# Patient Record
Sex: Male | Born: 1960 | Race: White | Hispanic: No | Marital: Married | Smoking: Former smoker
Health system: Southern US, Community
[De-identification: ages and names within clinical notes are randomized; demographics above are authoritative.]

## PROBLEM LIST (undated history)

## (undated) DIAGNOSIS — I10 Essential (primary) hypertension: Secondary | ICD-10-CM

## (undated) DIAGNOSIS — Z8489 Family history of other specified conditions: Secondary | ICD-10-CM

## (undated) DIAGNOSIS — Z87442 Personal history of urinary calculi: Secondary | ICD-10-CM

## (undated) HISTORY — PX: LITHOTRIPSY: SUR834

---

## 2014-10-25 HISTORY — PX: COLONOSCOPY: SHX174

## 2017-08-17 HISTORY — PX: BRONCHOSCOPY: SUR163

## 2017-08-25 HISTORY — PX: LUNG BIOPSY: SHX232

## 2017-12-01 ENCOUNTER — Ambulatory Visit (HOSPITAL_COMMUNITY)
Admission: EM | Admit: 2017-12-01 | Discharge: 2017-12-01 | Disposition: A | Discharge status: Home / Self Care | Payer: 59 | Attending: Family Medicine | Admitting: Family Medicine

## 2017-12-01 ENCOUNTER — Encounter (HOSPITAL_COMMUNITY): Payer: Self-pay | Admitting: Family Medicine

## 2017-12-01 DIAGNOSIS — I10 Essential (primary) hypertension: Secondary | ICD-10-CM

## 2017-12-01 DIAGNOSIS — Z760 Encounter for issue of repeat prescription: Secondary | ICD-10-CM

## 2017-12-01 HISTORY — DX: Essential (primary) hypertension: I10

## 2017-12-01 MED ORDER — LOSARTAN POTASSIUM 100 MG PO TABS: 100.0000 mg | ORAL_TABLET | Freq: Every day | ORAL | 0 refills | Status: DC

## 2017-12-01 MED ORDER — AMLODIPINE BESYLATE 10 MG PO TABS: 10.0000 mg | ORAL_TABLET | Freq: Every day | ORAL | 0 refills | Status: DC

## 2017-12-01 NOTE — ED Triage Notes (Signed)
Pt here for BP refill. He takes amlodipine 10 mg and Losartan 100 mg. Has been taking his losartan but out of amlodipine. Denies nay chest pain, headaches, dizziness or SOB.

## 2017-12-05 NOTE — ED Provider Notes (Signed)
  [place]   [id number] 12/01/17 Arrival Time: 1004  ASSESSMENT & PLAN:  1. Essential hypertension   2. Medication refill     Meds ordered this encounter  Medications  . amLODipine (NORVASC) 10 MG tablet    Sig: Take 1 tablet (10 mg total) by mouth daily.    Dispense:  90 tablet    Refill:  0  . losartan (COZAAR) 100 MG tablet    Sig: Take 1 tablet (100 mg total) by mouth daily.    Dispense:  90 tablet    Refill:  0   May f/u here as needed.  Reviewed expectations re: course of current medical issues. Questions answered. Outlined signs and symptoms indicating need for more acute intervention. Patient verbalized understanding. After Visit Summary given.   SUBJECTIVE: History from: patient. Chase Hogan is a 57 y.o. male who presents requesting medication refill. No current concerns.  Current medical problems include: Past Medical History:  Diagnosis Date  . Hypertension    Current Outpatient Medications:  .  amLODipine (NORVASC) 10 MG tablet, Take 1 tablet (10 mg total) by mouth daily., Disp: 90 tablet, Rfl: 0 .  losartan (COZAAR) 100 MG tablet, Take 1 tablet (100 mg total) by mouth daily., Disp: 90 tablet, Rfl: 0  ROS: As per HPI.   OBJECTIVE:  Vitals:   12/01/17 1042  BP: (!) 149/87  Pulse: 83  Resp: 18  Temp: 98.2 F (36.8 C)  SpO2: 97%    General appearance: alert; no distress Lungs: clear to auscultation bilaterally Heart: regular rate and rhythm Abdomen: soft, non-tender Back: no CVA tenderness Extremities: no cyanosis or edema; symmetrical with no gross deformities Skin: warm and dry Neurologic: normal gait; normal symmetric reflexes Psychological: alert and cooperative; normal mood and affect  No Known Allergies  Past Medical History:  Diagnosis Date  . Hypertension    Social History   Socioeconomic History  . Marital status: Married    Spouse name: Not on file  . Number of children: Not on file  . Years of education: Not  on file  . Highest education level: Not on file  Social Needs  . Financial resource strain: Not on file  . Food insecurity - worry: Not on file  . Food insecurity - inability: Not on file  . Transportation needs - medical: Not on file  . Transportation needs - non-medical: Not on file  Occupational History  . Not on file  Tobacco Use  . Smoking status: Never Smoker  . Smokeless tobacco: Never Used  Substance and Sexual Activity  . Alcohol use: Not on file  . Drug use: Not on file  . Sexual activity: Not on file  Other Topics Concern  . Not on file  Social History Narrative  . Not on file      Vanessa Kick, MD 12/05/17 [phone]

## 2018-01-10 ENCOUNTER — Other Ambulatory Visit: Payer: Self-pay | Admitting: *Deleted

## 2018-01-10 ENCOUNTER — Other Ambulatory Visit: Payer: Self-pay

## 2018-01-10 ENCOUNTER — Encounter: Payer: Self-pay | Admitting: Thoracic Surgery (Cardiothoracic Vascular Surgery)

## 2018-01-10 ENCOUNTER — Institutional Professional Consult (permissible substitution) (INDEPENDENT_AMBULATORY_CARE_PROVIDER_SITE_OTHER): Payer: 59 | Admitting: Thoracic Surgery (Cardiothoracic Vascular Surgery)

## 2018-01-10 VITALS — BP 147/89 | HR 89 | Resp 18 | Ht 66.0 in | Wt 202.6 lb

## 2018-01-10 DIAGNOSIS — R911 Solitary pulmonary nodule: Secondary | ICD-10-CM

## 2018-01-10 DIAGNOSIS — J9859 Other diseases of mediastinum, not elsewhere classified: Secondary | ICD-10-CM | POA: Diagnosis not present

## 2018-01-10 DIAGNOSIS — R918 Other nonspecific abnormal finding of lung field: Secondary | ICD-10-CM

## 2018-01-10 NOTE — Progress Notes (Signed)
PCP is Patient, No Pcp Per Referring Provider is Adrian Prows, MD  Chief Complaint  Patient presents with  . Lung Mass    New patient, Chest CT 12/19/2017    HPI: Mr. Carico is sent for consultation regarding a mediastinal mass.  Mr. 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  He went for a physical exam.  A chest x-ray showed a questionable lung nodule.  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  He had bronchoscopy.  He thinks that was in November.  It showed benign lymphoid cells.  A repeat CT recently showed an increase in size of the AP window mass to 5.2 x 7.5 cm.  He says they advised him to have surgery, but he wanted to come to [facility] as he is moving here in 2 weeks.  He saw Dr. Thayer Jew far.  An ECG showed poor R wave progression.  He was referred to Dr. Einar Gip.  He scheduled a stress test for early April.  He has not been having any chest pain, pressure, or tightness.  He does get short of breath with heavy exertion but that has not changed recently.  He has noted itching.  He denies fevers, chills, night sweats, change in appetite, weight loss, headaches, and visual changes.  He does use smokeless tobacco.  He smoked briefly but quit at age 89.  He continues to work full-time.   Past Medical History:  Diagnosis Date  . Hypertension     Past Surgical History:  Procedure Laterality Date  . BRONCHOSCOPY  08/17/2017  . COLONOSCOPY  2016  . LUNG BIOPSY  08/2017    Family History  Problem Relation Age of Onset  . Cancer Mother   . Other Father        has pacemaker    Social History Social History   Tobacco Use  . Smoking status: Former Research scientist (life sciences)  . Smokeless tobacco: Current User    Types: Chew  . Tobacco comment: quit in 20's  Substance Use Topics  . Alcohol use: Yes    Comment: Beer  . Drug use: No    Current Outpatient Medications   Medication Sig Dispense Refill  . amLODipine (NORVASC) 10 MG tablet Take 1 tablet (10 mg total) by mouth daily. 90 tablet 0  . losartan (COZAAR) 100 MG tablet Take 1 tablet (100 mg total) by mouth daily. 90 tablet 0  . Multiple Vitamin (MULTIVITAMIN) tablet Take 1 tablet by mouth daily.     No current facility-administered medications for this visit.     Allergies  Allergen Reactions  . Lisinopril Cough    Review of Systems  Constitutional: Negative for activity change, appetite change, chills, diaphoresis, fever and unexpected weight change.  HENT: Negative for trouble swallowing and voice change.   Eyes: Negative for visual disturbance.  Respiratory: Positive for shortness of breath. Negative for cough and wheezing.   Cardiovascular: Negative for chest pain, palpitations and leg swelling.  Gastrointestinal: Negative for abdominal distention and abdominal pain.  Endocrine: Negative for polydipsia and polyphagia.  Genitourinary: Negative for dysuria and hematuria.       History kidney stones  Musculoskeletal: Negative for arthralgias and myalgias.  Neurological: Negative for weakness and headaches.  Hematological: Negative for adenopathy. Does not bruise/bleed easily.  All other systems reviewed and are negative.   BP (!) 147/89 (BP Location: Left  Arm, Patient Position: Sitting, Cuff Size: Large)   Pulse 89   Resp 18   Ht 5\' 6"  (1.676 m)   Wt 202 lb 9.6 oz (91.9 kg)   SpO2 97% Comment: RA  BMI 32.70 kg/m  Physical Exam  Constitutional: He is oriented to person, place, and time. He appears well-developed and well-nourished. No distress.  HENT:  Head: Normocephalic and atraumatic.  Mouth/Throat: No oropharyngeal exudate.  Eyes: Conjunctivae and EOM are normal. No scleral icterus.  Neck: Neck supple. No thyromegaly present.  Cardiovascular: Normal rate, regular rhythm and normal heart sounds. Exam reveals no gallop and no friction rub.  No murmur heard. Pulmonary/Chest:  Effort normal. No respiratory distress. He has no wheezes.  Abdominal: Soft. He exhibits no distension. There is no tenderness.  Musculoskeletal: He exhibits no edema.  Lymphadenopathy:    He has no cervical adenopathy.  Neurological: He is alert and oriented to person, place, and time. No cranial nerve deficit. He exhibits normal muscle tone.  Skin: Skin is warm and dry.  Vitals reviewed.    Diagnostic Tests: CT chest [facility] 12/19/2017 Official report reads Conclusions Enlarging aggressive appearing AP window mass.  By report a prior biopsy was negative.  However, with its enlarging size and appearance recommend repeat tissue diagnosis. Indeterminate bilateral pulmonary nodules Bilateral nephrolithiasis   I personally reviewed the CT images and concur with the findings of a large AP window mass.  There are multiple small lung nodules.  Impression: Mr. 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  There is no definite invasion of surrounding structures but there is significant extrinsic compression of the left pulmonary artery.  The differential diagnosis includes metastatic lung cancer, lymphoma, and vasculitis.  Lymphoma is the most likely, but I have seen Takayasu's arteritis present in a similar fashion.   He needs a tissue biopsy to establish a diagnosis.  I am not surprised that they were unable to obtain a diagnosis with a needle aspiration.  More tissue will be necessary to definitively determine the disease process.  Based on his CT it appears that mediastinoscopy would allow access to biopsy the mass.  Barbra Sarks procedure is another consideration.  I think the first step at this point is to do a PET/CT.  That will guide our initial diagnostic workup.  It could reveal another site of disease it would be easier to biopsy.  If the disease is isolated to the aortopulmonary window the next step would be  mediastinum anoscopy.  I described the general nature of mediastinoscopy to him.  He understands this is a surgical procedure done in the operating room under general anesthesia.  It is diagnostic and not curative.  We would plan to do it as an outpatient.  Overall it is a relatively low risk procedure, but the rare complications do happen can be serious.  I informed him of the indications, risks, benefits, and alternatives.  He understands the risk include, but not limited to death, MI, DVT, PE, bleeding, possible need for transfusion, possible need for sternotomy or thoracotomy, stroke, infection, recurrent nerve injury leading to hoarseness, pneumothorax, as well as possibility of other unforeseeable complications.  Plan: PET/CT  Return to discuss results of PET and schedule mediastinoscopy (if that turns out to be the best approach)  [name], MD [facility] [phone]

## 2018-01-27 ENCOUNTER — Encounter (HOSPITAL_COMMUNITY)
Admission: RE | Admit: 2018-01-27 | Discharge: 2018-01-27 | Disposition: A | Discharge status: Home / Self Care | Payer: 59 | Source: Ambulatory Visit | Attending: Thoracic Surgery (Cardiothoracic Vascular Surgery) | Admitting: Thoracic Surgery (Cardiothoracic Vascular Surgery)

## 2018-01-27 ENCOUNTER — Encounter: Payer: Self-pay | Admitting: Cardiology

## 2018-01-27 DIAGNOSIS — R918 Other nonspecific abnormal finding of lung field: Secondary | ICD-10-CM | POA: Diagnosis present

## 2018-01-27 LAB — GLUCOSE, CAPILLARY: Glucose-Capillary: 102 mg/dL — ABNORMAL HIGH (ref 65–99)

## 2018-01-27 MED ORDER — FLUDEOXYGLUCOSE F - 18 (FDG) INJECTION
10.0000 | Freq: Once | INTRAVENOUS | Status: AC | PRN
  Administered 2018-01-27: 10 via INTRAVENOUS

## 2018-01-31 ENCOUNTER — Encounter: Payer: Self-pay | Admitting: Thoracic Surgery (Cardiothoracic Vascular Surgery)

## 2018-01-31 ENCOUNTER — Other Ambulatory Visit: Payer: Self-pay | Admitting: *Deleted

## 2018-01-31 ENCOUNTER — Other Ambulatory Visit: Payer: Self-pay

## 2018-01-31 ENCOUNTER — Ambulatory Visit (INDEPENDENT_AMBULATORY_CARE_PROVIDER_SITE_OTHER): Payer: 59 | Admitting: Thoracic Surgery (Cardiothoracic Vascular Surgery)

## 2018-01-31 VITALS — BP 130/78 | HR 78 | Resp 18 | Ht 66.0 in | Wt 199.0 lb

## 2018-01-31 DIAGNOSIS — J9859 Other diseases of mediastinum, not elsewhere classified: Secondary | ICD-10-CM

## 2018-01-31 DIAGNOSIS — Q214 Aortopulmonary septal defect: Secondary | ICD-10-CM

## 2018-01-31 NOTE — Progress Notes (Signed)
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  A chest x-ray showed a possible lung nodule.  A CT of the chest showed a 4.6 x 6.5 cm aortopulmonary window mass.  Bronchoscopy with endobronchial ultrasound showed benign lymphoid cells.  He recently had a repeat CT which showed the mass had increased to 5.2 x 7.5 cm.  He was advised to have surgery in [employer] but he declined as he was planning to moved to [place].  After moving to town he saw Dr. Shelia Media.  An ECG showed poor R wave progression.  He was referred to Dr. Einar Gip.  He had not had any chest pain, pressure, or tightness, but is scheduled for a stress test.  He has been feeling well.  He denies fevers, chills, night sweats, chest pain, shortness of breath, weight loss or change in appetite.  Zubrod Score: At the time of surgery this patient's most appropriate activity status/level should be described as: [x]     0    Normal activity, no symptoms []     1    Restricted in physical strenuous activity but ambulatory, able to do out light work []     2    Ambulatory and capable of self care, unable to do work activities, up and about >50 % of waking hours                              []     3    Only limited self care, in bed greater than 50% of waking hours []     4    Completely disabled, no self care, confined to bed or chair []     5    Moribund Past Medical History:  Diagnosis Date  . Hypertension     Current Outpatient Medications  Medication Sig Dispense Refill  . amLODipine (NORVASC) 10 MG tablet Take 1 tablet (10 mg total) by mouth daily. 90 tablet 0  . losartan (COZAAR) 100 MG tablet Take 1 tablet (100 mg total) by mouth daily. 90 tablet 0  . Multiple Vitamin  (MULTIVITAMIN) tablet Take 1 tablet by mouth daily.     No current facility-administered medications for this visit.     Physical Exam BP 130/78 (BP Location: Left Arm, Patient Position: Sitting, Cuff Size: Large)   Pulse 78   Resp 18   Ht 5\' 6"  (1.676 m)   Wt 199 lb (90.3 kg)   SpO2 98% Comment: RA  BMI 32.11 kg/m  57 year old man in no acute distress Alert and oriented x3 with no focal deficits  Diagnostic Tests:   Impression: Chase Hogan is a 57 year old man with a 5 x 7 cm aortopulmonary window mass that is markedly hypermetabolic on PET/CT.  This mass has been present since last fall when it was first discovered while he was living in [place].  A previous needle aspiration was nondiagnostic.  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  Malignant thymoma is in the differential but this would be a very unusual appearance for that.  Germ cell tumors are also in the  differential.  Regardless he needs a tissue biopsy to guide treatment.  I recommended that we proceed with a mediastinum anoscopy.  I think we should be able to get the diagnosis from that approach, but if we are not able to I would then proceed at the same setting with a left anterior mediastinotomy Barbra Sarks).  I informed the patient and his wife of the general nature of the procedure.  They understand this is diagnostic and not therapeutic.  They understand the general nature of the surgery and the incisions to be used.  I informed him of the indications, risks, benefits, and alternatives.  They understand the risk include, but are not limited to death, MI, DVT, PE, bleeding, possible need for transfusion, possible need for sternotomy or thoracotomy, recurrent nerve injury leading to hoarseness, pneumothorax, as well as the possibility of other unforeseeable complications.  He accepts the risks and wishes to proceed.  I spoke with Dr. Adrian Prows by telephone.  He feels the patient is low risk and okay to proceed with surgery.   He will try to see if he can get the stress test done prior to the procedure.  Plan: Mediastinoscopy, possible left anterior mediastinotomy on Friday, 02/10/2018  [name], MD [facility] [phone]

## 2018-01-31 NOTE — H&P (View-Only) (Signed)
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  A chest x-ray showed a possible lung nodule.  A CT of the chest showed a 4.6 x 6.5 cm aortopulmonary window mass.  Bronchoscopy with endobronchial ultrasound showed benign lymphoid cells.  He recently had a repeat CT which showed the mass had increased to 5.2 x 7.5 cm.  He was advised to have surgery in [place] but he declined as he was planning to moved to [place].  After moving to town he saw Dr. Shelia Media.  An ECG showed poor R wave progression.  He was referred to Dr. Einar Gip.  He had not had any chest pain, pressure, or tightness, but is scheduled for a stress test.  He has been feeling well.  He denies fevers, chills, night sweats, chest pain, shortness of breath, weight loss or change in appetite.  Zubrod Score: At the time of surgery this patient's most appropriate activity status/level should be described as: [x]     0    Normal activity, no symptoms []     1    Restricted in physical strenuous activity but ambulatory, able to do out light work []     2    Ambulatory and capable of self care, unable to do work activities, up and about >50 % of waking hours                              []     3    Only limited self care, in bed greater than 50% of waking hours []     4    Completely disabled, no self care, confined to bed or chair []     5    Moribund Past Medical History:  Diagnosis Date  . Hypertension     Current Outpatient Medications  Medication Sig Dispense Refill  . amLODipine (NORVASC) 10 MG tablet Take 1 tablet (10 mg total) by mouth daily. 90 tablet 0  . losartan (COZAAR) 100 MG tablet Take 1 tablet (100 mg total) by mouth daily. 90 tablet 0  . Multiple Vitamin  (MULTIVITAMIN) tablet Take 1 tablet by mouth daily.     No current facility-administered medications for this visit.     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  This mass has been present since last fall when it was first discovered while he was living in [place].  A previous needle aspiration was nondiagnostic.  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  Malignant thymoma is in the differential but this would be a very unusual appearance for that.  Germ cell tumors are also in the  differential.  Regardless he needs a tissue biopsy to guide treatment.  I recommended that we proceed with a mediastinum anoscopy.  I think we should be able to get the diagnosis from that approach, but if we are not able to I would then proceed at the same setting with a left anterior mediastinotomy Chase Hogan).  I informed the patient and his wife of the general nature of the procedure.  They understand this is diagnostic and not therapeutic.  They understand the general nature of the surgery and the incisions to be used.  I informed him of the indications, risks, benefits, and alternatives.  They understand the risk include, but are not limited to death, MI, DVT, PE, bleeding, possible need for transfusion, possible need for sternotomy or thoracotomy, recurrent nerve injury leading to hoarseness, pneumothorax, as well as the possibility of other unforeseeable complications.  He accepts the risks and wishes to proceed.  I spoke with Dr. Adrian Prows by telephone.  He feels the patient is low risk and okay to proceed with surgery.   He will try to see if he can get the stress test done prior to the procedure.  Plan: Mediastinoscopy, possible left anterior mediastinotomy on Friday, 02/10/2018  [name], MD [facility] [phone]

## 2018-02-01 ENCOUNTER — Encounter: Payer: Self-pay | Admitting: *Deleted

## 2018-02-02 NOTE — Progress Notes (Signed)
Order(s) created erroneously. Erroneous order ID: [id number]  Order moved by: Kelly Splinter  Order move date/time: 02/02/2018 2:05 PM  Source Patient: [id number]  Source Contact: 02/01/2018  Destination Patient: [id number]  Destination Contact: 02/03/2017

## 2018-02-07 NOTE — Pre-Procedure Instructions (Signed)
Chase Hogan  02/07/2018      [id number] [place] [place] [address] [place] [address] Phone: [phone] Fax: [phone]    Your procedure is scheduled on Friday April 19.  Report to [facility] Admitting at 5:30 A.M.  Call this number if you have problems the morning of surgery:  [phone]   Remember:  Do not eat food or drink liquids after midnight.  Take these medicines the morning of surgery with A SIP OF WATER: amlodipine (Norvasc)  7 days prior to surgery STOP taking any Aspirin(unless otherwise instructed by your surgeon), Aleve, Naproxen, Ibuprofen, Motrin, Advil, Goody's, BC's, all herbal medications, fish oil, and all vitamins    Do not wear jewelry, make-up or nail polish.  Do not wear lotions, powders, or perfumes, or deodorant.  Do not shave 48 hours prior to surgery.  Men may shave face and neck.  Do not bring valuables to the hospital.  [facility] is not responsible for any belongings or valuables.  Contacts, dentures or bridgework may not be worn into surgery.  Leave your suitcase in the car.  After surgery it may be brought to your room.  For patients admitted to the hospital, discharge time will be determined by your treatment team.  Patients discharged the day of surgery will not be allowed to drive home.   Special instructions:    [place]- Preparing For Surgery  Before surgery, you can play an important role. Because skin is not sterile, your skin needs to be as free of germs as possible. You can reduce the number of germs on your skin by washing with CHG (chlorahexidine gluconate) Soap before surgery.  CHG is an antiseptic cleaner which kills germs and bonds with the skin to continue killing germs even after washing.  Please do not use if you have an allergy to CHG or antibacterial soaps. If your skin becomes reddened/irritated stop using the  CHG.  Do not shave (including legs and underarms) for at least 48 hours prior to first CHG shower. It is OK to shave your face.  Please follow these instructions carefully.   1. Shower the NIGHT BEFORE SURGERY and the MORNING OF SURGERY with CHG.   2. If you chose to wash your hair, wash your hair first as usual with your normal shampoo.  3. After you shampoo, rinse your hair and body thoroughly to remove the shampoo.  4. Use CHG as you would any other liquid soap. You can apply CHG directly to the skin and wash gently with a scrungie or a clean washcloth.   5. Apply the CHG Soap to your body ONLY FROM THE NECK DOWN.  Do not use on open wounds or open sores. Avoid contact with your eyes, ears, mouth and genitals (private parts). Wash Face and genitals (private parts)  with your normal soap.  6. Wash thoroughly, paying special attention to the area where your surgery will be performed.  7. Thoroughly rinse your body with warm water from the neck down.  8. DO NOT shower/wash with your normal soap after using and rinsing off the CHG Soap.  9. Pat yourself dry with a CLEAN TOWEL.  10. Wear CLEAN PAJAMAS to bed the night before surgery, wear comfortable clothes the morning of surgery  11. Place CLEAN SHEETS on your bed the night of your first shower and DO NOT SLEEP WITH PETS.  Day of Surgery: Do not apply any deodorants/lotions. Please wear clean clothes to the hospital/surgery center.      Please read over the following fact sheets that you were given. Coughing and Deep Breathing, MRSA Information and Surgical Site Infection Prevention

## 2018-02-08 ENCOUNTER — Encounter (HOSPITAL_COMMUNITY): Payer: Self-pay

## 2018-02-08 ENCOUNTER — Ambulatory Visit (HOSPITAL_COMMUNITY)
Admission: RE | Admit: 2018-02-08 | Discharge: 2018-02-08 | Disposition: A | Discharge status: Home / Self Care | Payer: 59 | Source: Ambulatory Visit | Attending: Thoracic Surgery (Cardiothoracic Vascular Surgery) | Admitting: Thoracic Surgery (Cardiothoracic Vascular Surgery)

## 2018-02-08 ENCOUNTER — Other Ambulatory Visit: Payer: Self-pay

## 2018-02-08 ENCOUNTER — Encounter (HOSPITAL_COMMUNITY)
Admission: RE | Admit: 2018-02-08 | Discharge: 2018-02-08 | Disposition: A | Discharge status: Home / Self Care | Payer: 59 | Source: Ambulatory Visit | Attending: Thoracic Surgery (Cardiothoracic Vascular Surgery) | Admitting: Thoracic Surgery (Cardiothoracic Vascular Surgery)

## 2018-02-08 DIAGNOSIS — R918 Other nonspecific abnormal finding of lung field: Secondary | ICD-10-CM | POA: Diagnosis not present

## 2018-02-08 DIAGNOSIS — I10 Essential (primary) hypertension: Secondary | ICD-10-CM | POA: Diagnosis not present

## 2018-02-08 DIAGNOSIS — Z0183 Encounter for blood typing: Secondary | ICD-10-CM | POA: Insufficient documentation

## 2018-02-08 DIAGNOSIS — Z87442 Personal history of urinary calculi: Secondary | ICD-10-CM | POA: Insufficient documentation

## 2018-02-08 DIAGNOSIS — Q214 Aortopulmonary septal defect: Secondary | ICD-10-CM

## 2018-02-08 DIAGNOSIS — Z01818 Encounter for other preprocedural examination: Secondary | ICD-10-CM | POA: Diagnosis not present

## 2018-02-08 DIAGNOSIS — Z79899 Other long term (current) drug therapy: Secondary | ICD-10-CM | POA: Diagnosis not present

## 2018-02-08 DIAGNOSIS — Z01812 Encounter for preprocedural laboratory examination: Secondary | ICD-10-CM | POA: Diagnosis not present

## 2018-02-08 HISTORY — DX: Family history of other specified conditions: Z84.89

## 2018-02-08 HISTORY — DX: Personal history of urinary calculi: Z87.442

## 2018-02-08 LAB — TYPE AND SCREEN
ABO/RH(D): O POS
Antibody Screen: NEGATIVE

## 2018-02-08 LAB — CBC
HEMATOCRIT: 37.4 % — AB (ref 39.0–52.0)
Hemoglobin: 12.3 g/dL — ABNORMAL LOW (ref 13.0–17.0)
MCH: 27.7 pg (ref 26.0–34.0)
MCHC: 32.9 g/dL (ref 30.0–36.0)
MCV: 84.2 fL (ref 78.0–100.0)
PLATELETS: 382 10*3/uL (ref 150–400)
RBC: 4.44 MIL/uL (ref 4.22–5.81)
RDW: 13.9 % (ref 11.5–15.5)
WBC: 7.6 10*3/uL (ref 4.0–10.5)

## 2018-02-08 LAB — BLOOD GAS, ARTERIAL
ACID-BASE EXCESS: 1.9 mmol/L (ref 0.0–2.0)
Bicarbonate: 25.4 mmol/L (ref 20.0–28.0)
DRAWN BY: 470591
FIO2: 0.21
O2 Saturation: 96.8 %
PCO2 ART: 35.9 mmHg (ref 32.0–48.0)
PH ART: 7.463 — AB (ref 7.350–7.450)
Patient temperature: 98.6
pO2, Arterial: 85.3 mmHg (ref 83.0–108.0)

## 2018-02-08 LAB — COMPREHENSIVE METABOLIC PANEL
ALT: 18 U/L (ref 17–63)
AST: 19 U/L (ref 15–41)
Albumin: 4.1 g/dL (ref 3.5–5.0)
Alkaline Phosphatase: 89 U/L (ref 38–126)
Anion gap: 10 (ref 5–15)
BUN: 10 mg/dL (ref 6–20)
CALCIUM: 9.5 mg/dL (ref 8.9–10.3)
CHLORIDE: 105 mmol/L (ref 101–111)
CO2: 22 mmol/L (ref 22–32)
CREATININE: 0.95 mg/dL (ref 0.61–1.24)
Glucose, Bld: 98 mg/dL (ref 65–99)
Potassium: 3.7 mmol/L (ref 3.5–5.1)
Sodium: 137 mmol/L (ref 135–145)
Total Bilirubin: 0.5 mg/dL (ref 0.3–1.2)
Total Protein: 7.2 g/dL (ref 6.5–8.1)

## 2018-02-08 LAB — URINALYSIS, ROUTINE W REFLEX MICROSCOPIC
Bilirubin Urine: NEGATIVE
GLUCOSE, UA: NEGATIVE mg/dL
HGB URINE DIPSTICK: NEGATIVE
Ketones, ur: NEGATIVE mg/dL
LEUKOCYTES UA: NEGATIVE
Nitrite: NEGATIVE
PROTEIN: NEGATIVE mg/dL
Specific Gravity, Urine: 1.018 (ref 1.005–1.030)
pH: 6 (ref 5.0–8.0)

## 2018-02-08 LAB — ABO/RH: ABO/RH(D): O POS

## 2018-02-08 LAB — SURGICAL PCR SCREEN
MRSA, PCR: NEGATIVE
Staphylococcus aureus: POSITIVE — AB

## 2018-02-08 LAB — APTT: APTT: 34 s (ref 24–36)

## 2018-02-08 LAB — PROTIME-INR
INR: 1.04
PROTHROMBIN TIME: 13.5 s (ref 11.4–15.2)

## 2018-02-08 NOTE — Progress Notes (Signed)
Surgical PCR +staph aureus. [phone]. Patient notified of result and to start Mupirocin as instructed at PAT appointment. Verbalized understanding.

## 2018-02-08 NOTE — Progress Notes (Signed)
PCP - no PCP Cardiologist - Dr Einar Gip, note scanned in epic   Chest x-ray - 02/08/2018  EKG - Requested from Dr. Einar Gip ECHO - 02/01/18 scanned under notes section in epic  Patient denies shortness of breath, fever, cough and chest pain at PAT appointment  Patient verbalized understanding of instructions that were given to them at the PAT appointment. Patient was also instructed that they will need to review over the PAT instructions again at home before surgery.

## 2018-02-09 NOTE — Progress Notes (Signed)
Anesthesia Chart Review:   Case:  [id number] Date/Time:  02/10/18 0845   Procedures:      MEDIASTINOSCOPY (N/A )     possible LEFT ANTERIOR MEDIASTINOTOMY CHAMBERLAIN MCNEIL (N/A )   Anesthesia type:  General   Pre-op diagnosis:  AP WINDOW MASS   Location:  [facility] OR ROOM 10 / [place] [facility]   Surgeon:  [name], MD      DISCUSSION: Pt is a 57 year old male.  Dr. Leonarda Salon 01/31/18 note documents cardiologist Dr. Einar Gip felt pt could have surgery.   VS: BP 132/77   Pulse 72   Temp 36.8 C   Resp 18   Ht 5\' 6"  (1.676 m)   SpO2 97%   BMI 32.12 kg/m   PROVIDERS: - No PCP  - Cardiologist is Kela Millin, MD who spoke with Dr. Roxan Hockey and felt pt could proceed with surgery.    LABS: Labs reviewed: Acceptable for surgery. (all labs ordered are listed, but only abnormal results are displayed)  Labs Reviewed  SURGICAL PCR SCREEN - Abnormal; Notable for the following components:      Result Value   Staphylococcus aureus POSITIVE (*)    All other components within normal limits  BLOOD GAS, ARTERIAL - Abnormal; Notable for the following components:   pH, Arterial 7.463 (*)    All other components within normal limits  CBC - Abnormal; Notable for the following components:   Hemoglobin 12.3 (*)    HCT 37.4 (*)    All other components within normal limits  APTT  COMPREHENSIVE METABOLIC PANEL  PROTIME-INR  URINALYSIS, ROUTINE W REFLEX MICROSCOPIC  TYPE AND SCREEN  ABO/RH     IMAGES: CXR 02/08/18: Mass density in the aortopulmonary window and region of the aortic arch.   EKG 12/29/17 [facility] cardiovascular): NSR. Right axis deviation. Poor R wave progression, probably normal variant. No evidence of ischemia.    CV:  Echo 01/27/18 (found in media tab):  1. LV cavity is normal in size. Moderate concentric LVH. Normal global wall motion. Doppler evidence of grade II diastolic dysfunction. Diastolic dysfunction findings suggests elevated LA/LV end diastolic pressure.  Calculated EF 61% 2. LA cavity moderately dilated at 4.6cm 3. Mild aortic regurgitation 4. Mild to moderate mitral regurgitation 5. Mild tricuspid regurgitation. No evidence of pulmonary HTN.  6. Mild pulmonic regurgitation   Past Medical History:  Diagnosis Date  . Family history of adverse reaction to anesthesia    Daughter had to have smaller breathing tube 58-24 years old  . History of kidney stones   . Hypertension     Past Surgical History:  Procedure Laterality Date  . BRONCHOSCOPY  08/17/2017  . COLONOSCOPY  2016  . LITHOTRIPSY     "a few times"  . LUNG BIOPSY  08/2017   pt unsure about this    MEDICATIONS: . amLODipine (NORVASC) 10 MG tablet  . Aspirin-Acetaminophen-Caffeine (GOODYS EXTRA STRENGTH) 520-260-32.5 MG PACK  . losartan (COZAAR) 100 MG tablet  . Multiple Vitamin (MULTIVITAMIN WITH MINERALS) TABS tablet   No current facility-administered medications for this encounter.     If no changes, I anticipate pt can proceed with surgery as scheduled.   Willeen Cass, FNP-BC [facility]/Anesthesiology Phone: [phone] 02/09/2018 9:26 AM

## 2018-02-10 ENCOUNTER — Ambulatory Visit (HOSPITAL_COMMUNITY): Payer: 59 | Admitting: Certified Registered"

## 2018-02-10 ENCOUNTER — Encounter (HOSPITAL_COMMUNITY): Payer: Self-pay | Admitting: Urology

## 2018-02-10 ENCOUNTER — Encounter (HOSPITAL_COMMUNITY)
Admission: RE | Disposition: A | Discharge status: Home / Self Care | Payer: Self-pay | Source: Ambulatory Visit | Attending: Thoracic Surgery (Cardiothoracic Vascular Surgery)

## 2018-02-10 ENCOUNTER — Ambulatory Visit (HOSPITAL_COMMUNITY): Payer: 59 | Admitting: Emergency Medicine

## 2018-02-10 ENCOUNTER — Ambulatory Visit (HOSPITAL_COMMUNITY)
Admission: RE | Admit: 2018-02-10 | Discharge: 2018-02-10 | Disposition: A | Discharge status: Home / Self Care | Payer: 59 | Source: Ambulatory Visit | Attending: Thoracic Surgery (Cardiothoracic Vascular Surgery) | Admitting: Thoracic Surgery (Cardiothoracic Vascular Surgery)

## 2018-02-10 DIAGNOSIS — I10 Essential (primary) hypertension: Secondary | ICD-10-CM | POA: Insufficient documentation

## 2018-02-10 DIAGNOSIS — Q214 Aortopulmonary septal defect: Secondary | ICD-10-CM

## 2018-02-10 DIAGNOSIS — Z79899 Other long term (current) drug therapy: Secondary | ICD-10-CM | POA: Diagnosis not present

## 2018-02-10 DIAGNOSIS — Z87891 Personal history of nicotine dependence: Secondary | ICD-10-CM | POA: Insufficient documentation

## 2018-02-10 DIAGNOSIS — C381 Malignant neoplasm of anterior mediastinum: Secondary | ICD-10-CM | POA: Diagnosis not present

## 2018-02-10 DIAGNOSIS — J9859 Other diseases of mediastinum, not elsewhere classified: Secondary | ICD-10-CM | POA: Diagnosis not present

## 2018-02-10 DIAGNOSIS — R9389 Abnormal findings on diagnostic imaging of other specified body structures: Secondary | ICD-10-CM | POA: Diagnosis present

## 2018-02-10 HISTORY — PX: MEDIASTINOSCOPY: SHX5086

## 2018-02-10 SURGERY — MEDIASTINOSCOPY
Anesthesia: General

## 2018-02-10 MED ORDER — CEFAZOLIN SODIUM-DEXTROSE 2-4 GM/100ML-% IV SOLN
INTRAVENOUS | Status: AC
  Filled 2018-02-10: qty 100

## 2018-02-10 MED ORDER — CEFAZOLIN SODIUM-DEXTROSE 2-4 GM/100ML-% IV SOLN
2.0000 g | INTRAVENOUS | Status: AC
  Administered 2018-02-10: 2 g via INTRAVENOUS

## 2018-02-10 MED ORDER — DEXAMETHASONE SODIUM PHOSPHATE 10 MG/ML IJ SOLN
INTRAMUSCULAR | Status: DC | PRN
  Administered 2018-02-10: 10 mg via INTRAVENOUS

## 2018-02-10 MED ORDER — BUPIVACAINE HCL (PF) 0.5 % IJ SOLN
INTRAMUSCULAR | Status: AC
  Filled 2018-02-10: qty 30

## 2018-02-10 MED ORDER — PHENYLEPHRINE 40 MCG/ML (10ML) SYRINGE FOR IV PUSH (FOR BLOOD PRESSURE SUPPORT)
PREFILLED_SYRINGE | INTRAVENOUS | Status: DC | PRN
  Administered 2018-02-10: 80 ug via INTRAVENOUS

## 2018-02-10 MED ORDER — DEXAMETHASONE SODIUM PHOSPHATE 10 MG/ML IJ SOLN
INTRAMUSCULAR | Status: AC
  Filled 2018-02-10: qty 1

## 2018-02-10 MED ORDER — SUGAMMADEX SODIUM 200 MG/2ML IV SOLN
INTRAVENOUS | Status: DC | PRN
  Administered 2018-02-10: 370 mg via INTRAVENOUS

## 2018-02-10 MED ORDER — SODIUM CHLORIDE 0.9% FLUSH: 3.0000 mL | INTRAVENOUS | Status: DC | PRN

## 2018-02-10 MED ORDER — OXYCODONE HCL 5 MG PO TABS: 5.0000 mg | ORAL_TABLET | Freq: Four times a day (QID) | ORAL | Status: DC | PRN

## 2018-02-10 MED ORDER — ROCURONIUM BROMIDE 100 MG/10ML IV SOLN
INTRAVENOUS | Status: DC | PRN
  Administered 2018-02-10: 60 mg via INTRAVENOUS
  Administered 2018-02-10: 40 mg via INTRAVENOUS

## 2018-02-10 MED ORDER — SUGAMMADEX SODIUM 500 MG/5ML IV SOLN
INTRAVENOUS | Status: AC
  Filled 2018-02-10: qty 5

## 2018-02-10 MED ORDER — FENTANYL CITRATE (PF) 250 MCG/5ML IJ SOLN
INTRAMUSCULAR | Status: AC
  Filled 2018-02-10: qty 5

## 2018-02-10 MED ORDER — LIDOCAINE HCL (CARDIAC) PF 100 MG/5ML IV SOSY
PREFILLED_SYRINGE | INTRAVENOUS | Status: DC | PRN
  Administered 2018-02-10: 80 mg via INTRAVENOUS

## 2018-02-10 MED ORDER — MIDAZOLAM HCL 5 MG/5ML IJ SOLN
INTRAMUSCULAR | Status: DC | PRN
  Administered 2018-02-10: 2 mg via INTRAVENOUS

## 2018-02-10 MED ORDER — PHENYLEPHRINE 40 MCG/ML (10ML) SYRINGE FOR IV PUSH (FOR BLOOD PRESSURE SUPPORT)
PREFILLED_SYRINGE | INTRAVENOUS | Status: AC
  Filled 2018-02-10: qty 10

## 2018-02-10 MED ORDER — SODIUM CHLORIDE 0.9 % IV SOLN: 250.0000 mL | INTRAVENOUS | Status: DC | PRN

## 2018-02-10 MED ORDER — PROPOFOL 10 MG/ML IV BOLUS
INTRAVENOUS | Status: DC | PRN
  Administered 2018-02-10: 160 mg via INTRAVENOUS

## 2018-02-10 MED ORDER — ACETAMINOPHEN 325 MG PO TABS: 650.0000 mg | ORAL_TABLET | ORAL | Status: DC | PRN

## 2018-02-10 MED ORDER — OXYCODONE HCL 5 MG PO TABS: 5.0000 mg | ORAL_TABLET | Freq: Once | ORAL | Status: DC | PRN

## 2018-02-10 MED ORDER — HEMOSTATIC AGENTS (NO CHARGE) OPTIME
TOPICAL | Status: DC | PRN
  Administered 2018-02-10: 1 via TOPICAL

## 2018-02-10 MED ORDER — ACETAMINOPHEN 650 MG RE SUPP: 650.0000 mg | RECTAL | Status: DC | PRN

## 2018-02-10 MED ORDER — SODIUM CHLORIDE 0.9% FLUSH: 3.0000 mL | Freq: Two times a day (BID) | INTRAVENOUS | Status: DC

## 2018-02-10 MED ORDER — LACTATED RINGERS IV SOLN
INTRAVENOUS | Status: DC
  Administered 2018-02-10 (×2): via INTRAVENOUS

## 2018-02-10 MED ORDER — OXYCODONE HCL 5 MG/5ML PO SOLN: 5.0000 mg | Freq: Once | ORAL | Status: DC | PRN

## 2018-02-10 MED ORDER — ONDANSETRON HCL 4 MG/2ML IJ SOLN
INTRAMUSCULAR | Status: DC | PRN
  Administered 2018-02-10: 4 mg via INTRAVENOUS

## 2018-02-10 MED ORDER — ONDANSETRON HCL 4 MG/2ML IJ SOLN
INTRAMUSCULAR | Status: AC
  Filled 2018-02-10: qty 2

## 2018-02-10 MED ORDER — MIDAZOLAM HCL 2 MG/2ML IJ SOLN
INTRAMUSCULAR | Status: AC
  Filled 2018-02-10: qty 2

## 2018-02-10 MED ORDER — PROPOFOL 10 MG/ML IV BOLUS
INTRAVENOUS | Status: AC
  Filled 2018-02-10: qty 20

## 2018-02-10 MED ORDER — FENTANYL CITRATE (PF) 100 MCG/2ML IJ SOLN: 25.0000 ug | INTRAMUSCULAR | Status: DC | PRN

## 2018-02-10 MED ORDER — FENTANYL CITRATE (PF) 100 MCG/2ML IJ SOLN
INTRAMUSCULAR | Status: DC | PRN
  Administered 2018-02-10 (×2): 50 ug via INTRAVENOUS
  Administered 2018-02-10: 100 ug via INTRAVENOUS
  Administered 2018-02-10: 50 ug via INTRAVENOUS
  Administered 2018-02-10: 100 ug via INTRAVENOUS

## 2018-02-10 MED ORDER — SUGAMMADEX SODIUM 200 MG/2ML IV SOLN
INTRAVENOUS | Status: AC
  Filled 2018-02-10: qty 2

## 2018-02-10 MED ORDER — OXYCODONE HCL 5 MG PO TABS: 5.0000 mg | ORAL_TABLET | Freq: Four times a day (QID) | ORAL | 0 refills | Status: DC | PRN

## 2018-02-10 MED ORDER — 0.9 % SODIUM CHLORIDE (POUR BTL) OPTIME
TOPICAL | Status: DC | PRN
  Administered 2018-02-10: 1000 mL

## 2018-02-10 SURGICAL SUPPLY — 52 items
APPLIER CLIP LOGIC TI 5 (MISCELLANEOUS) IMPLANT
CANISTER SUCT 3000ML PPV (MISCELLANEOUS) ×3 IMPLANT
CLIP VESOCCLUDE MED 24/CT (CLIP) IMPLANT
CLIP VESOCCLUDE MED 6/CT (CLIP) ×6 IMPLANT
CONT SPEC 4OZ CLIKSEAL STRL BL (MISCELLANEOUS) ×9 IMPLANT
COVER SURGICAL LIGHT HANDLE (MISCELLANEOUS) ×3 IMPLANT
DERMABOND ADVANCED (GAUZE/BANDAGES/DRESSINGS) ×2
DERMABOND ADVANCED .7 DNX12 (GAUZE/BANDAGES/DRESSINGS) ×1 IMPLANT
DRAPE CHEST BREAST 15X10 FENES (DRAPES) ×3 IMPLANT
ELECT REM PT RETURN 9FT ADLT (ELECTROSURGICAL) ×3
ELECTRODE REM PT RTRN 9FT ADLT (ELECTROSURGICAL) ×1 IMPLANT
GAUZE SPONGE 4X4 12PLY STRL (GAUZE/BANDAGES/DRESSINGS) IMPLANT
GAUZE SPONGE 4X4 16PLY XRAY LF (GAUZE/BANDAGES/DRESSINGS) ×6 IMPLANT
GLOVE BIOGEL PI IND STRL 6.5 (GLOVE) ×1 IMPLANT
GLOVE BIOGEL PI INDICATOR 6.5 (GLOVE) ×2
GLOVE ECLIPSE 6.5 STRL STRAW (GLOVE) ×3 IMPLANT
GLOVE SURG SIGNA 7.5 PF LTX (GLOVE) ×3 IMPLANT
GLOVE SURG SS PI 7.0 STRL IVOR (GLOVE) ×3 IMPLANT
GOWN STRL REUS W/ TWL LRG LVL3 (GOWN DISPOSABLE) IMPLANT
GOWN STRL REUS W/ TWL XL LVL3 (GOWN DISPOSABLE) ×2 IMPLANT
GOWN STRL REUS W/TWL LRG LVL3 (GOWN DISPOSABLE)
GOWN STRL REUS W/TWL XL LVL3 (GOWN DISPOSABLE) ×4
HEMOSTAT SURGICEL 2X14 (HEMOSTASIS) ×3 IMPLANT
KIT BASIN OR (CUSTOM PROCEDURE TRAY) ×3 IMPLANT
KIT TURNOVER KIT B (KITS) ×3 IMPLANT
NEEDLE 22X1 1/2 (OR ONLY) (NEEDLE) ×3 IMPLANT
NS IRRIG 1000ML POUR BTL (IV SOLUTION) ×3 IMPLANT
PACK GENERAL/GYN (CUSTOM PROCEDURE TRAY) ×3 IMPLANT
PAD ARMBOARD 7.5X6 YLW CONV (MISCELLANEOUS) ×6 IMPLANT
SPONGE INTESTINAL PEANUT (DISPOSABLE) ×3 IMPLANT
SUT SILK 2 0 (SUTURE)
SUT SILK 2 0 TIES 10X30 (SUTURE) IMPLANT
SUT SILK 2-0 18XBRD TIE 12 (SUTURE) IMPLANT
SUT SILK 3 0 (SUTURE) ×2
SUT SILK 3 0 SH CR/8 (SUTURE) IMPLANT
SUT SILK 3-0 18XBRD TIE 12 (SUTURE) ×1 IMPLANT
SUT VIC AB 2-0 CT1 27 (SUTURE) ×2
SUT VIC AB 2-0 CT1 TAPERPNT 27 (SUTURE) ×1 IMPLANT
SUT VIC AB 3-0 SH 18 (SUTURE) ×3 IMPLANT
SUT VIC AB 3-0 SH 27 (SUTURE) ×2
SUT VIC AB 3-0 SH 27X BRD (SUTURE) ×1 IMPLANT
SUT VIC AB 3-0 X1 27 (SUTURE) ×3 IMPLANT
SUT VIC AB 4-0 PS2 27 (SUTURE) ×3 IMPLANT
SUT VICRYL 0 UR6 27IN ABS (SUTURE) IMPLANT
SUT VICRYL 4-0 PS2 18IN ABS (SUTURE) ×3 IMPLANT
SWAB COLLECTION DEVICE MRSA (MISCELLANEOUS) IMPLANT
SWAB CULTURE ESWAB REG 1ML (MISCELLANEOUS) IMPLANT
SYR 10ML LL (SYRINGE) ×6 IMPLANT
SYR CONTROL 10ML LL (SYRINGE) ×3 IMPLANT
TOWEL GREEN STERILE (TOWEL DISPOSABLE) ×3 IMPLANT
TOWEL GREEN STERILE FF (TOWEL DISPOSABLE) ×3 IMPLANT
WATER STERILE IRR 1000ML POUR (IV SOLUTION) ×3 IMPLANT

## 2018-02-10 NOTE — Transfer of Care (Signed)
Immediate Anesthesia Transfer of Care Note  Patient: [place] Hogan  Procedure(s) Performed: MEDIASTINOSCOPY (N/A )  Patient Location: PACU  Anesthesia Type:General  Level of Consciousness: awake and patient cooperative  Airway & Oxygen Therapy: Patient Spontanous Breathing  Post-op Assessment: Report given to RN and Post -op Vital signs reviewed and stable  Post vital signs: Reviewed and stable  Last Vitals:  Vitals Value Taken Time  BP 140/70 02/10/2018 10:25 AM  Temp 36.7 C 02/10/2018 10:25 AM  Pulse 74 02/10/2018 10:26 AM  Resp 17 02/10/2018 10:26 AM  SpO2 94 % 02/10/2018 10:26 AM  Vitals shown include unvalidated device data.  Last Pain:  Vitals:   02/10/18 1025  TempSrc:   PainSc: 0-No pain         Complications: No apparent anesthesia complications

## 2018-02-10 NOTE — Brief Op Note (Signed)
02/10/2018  10:32 AM  PATIENT:  Chase Hogan  57 y.o. male  PRE-OPERATIVE DIAGNOSIS:  AP WINDOW MASS  POST-OPERATIVE DIAGNOSIS:  AP WINDOW MASS  PROCEDURE:  Procedure(s): MEDIASTINOSCOPY (N/A)  SURGEON:  Surgeon(s) and Role:    * [name], MD - Primary  PHYSICIAN ASSISTANT:   ASSISTANTS: none   ANESTHESIA:   general  EBL:  200 mL   BLOOD ADMINISTERED:none  DRAINS: none   LOCAL MEDICATIONS USED:  NONE  SPECIMEN:  Source of Specimen:  AP window mass  DISPOSITION OF SPECIMEN:  PATHOLOGY  COUNTS:  YES  TOURNIQUET:  * No tourniquets in log *  DICTATION: .Other Dictation: Dictation Number -  PLAN OF CARE: Discharge to home after PACU  PATIENT DISPOSITION:  PACU - hemodynamically stable.   Delay start of Pharmacological VTE agent (>24hrs) due to surgical blood loss or risk of bleeding: not applicable

## 2018-02-10 NOTE — Anesthesia Procedure Notes (Signed)
Procedure Name: Intubation Date/Time: 02/10/2018 8:32 AM Performed by: Lance Coon, CRNA Pre-anesthesia Checklist: Patient identified, Emergency Drugs available, Suction available, Patient being monitored and Timeout performed Patient Re-evaluated:Patient Re-evaluated prior to induction Oxygen Delivery Method: Circle system utilized Preoxygenation: Pre-oxygenation with 100% oxygen Induction Type: IV induction Ventilation: Mask ventilation without difficulty Laryngoscope Size: Miller and 3 Grade View: Grade I Tube type: Oral Tube size: 7.5 mm Number of attempts: 1 Airway Equipment and Method: Stylet Placement Confirmation: ETT inserted through vocal cords under direct vision,  positive ETCO2 and breath sounds checked- equal and bilateral Secured at: 21 cm Tube secured with: Tape Dental Injury: Teeth and Oropharynx as per pre-operative assessment

## 2018-02-10 NOTE — Discharge Instructions (Addendum)
Do not drive or engage in heavy physical activity for 48 hours  Do not drive within 4 hours of taking oxycodone  You may shower tomorrow  There is a medical adhesive over the incision, it will begin to peel off in about 2 weeks  You have a prescription for oxycodone, a narcotic pain reliever, you may use as directed  You may use acetaminophen (Tylenol) or ibuprofen (Advil, Motrin) in addition to, or instead of, the oxycodone  You may place ice packs over the incision for the next few days if needed  Call [phone] if you develop chest pain, shortness of breath, fever > 101 F or notice excessive redness, swelling or tenderness around the incision  My office will contact you with follow up information

## 2018-02-10 NOTE — Anesthesia Postprocedure Evaluation (Signed)
Anesthesia Post Note  Patient: Truett Mcfarlan  Procedure(s) Performed: MEDIASTINOSCOPY (N/A )     Patient location during evaluation: PACU Anesthesia Type: General Level of consciousness: awake and alert Pain management: pain level controlled Vital Signs Assessment: post-procedure vital signs reviewed and stable Respiratory status: spontaneous breathing, nonlabored ventilation, respiratory function stable and patient connected to nasal cannula oxygen Cardiovascular status: blood pressure returned to baseline and stable Postop Assessment: no apparent nausea or vomiting Anesthetic complications: no    Last Vitals:  Vitals:   02/10/18 1058 02/10/18 1113  BP: 109/68 125/70  Pulse: 64 72  Resp: 18 14  Temp: 36.7 C   SpO2: 96% 94%    Last Pain:  Vitals:   02/10/18 1058  TempSrc:   PainSc: 2                  Idamae Coccia

## 2018-02-10 NOTE — Op Note (Signed)
NAME:  [place], Chase Hogan NO.:  [id number]  MEDICAL RECORD NO.:  [id number]  LOCATION:                                 FACILITY:  PHYSICIAN:  Revonda Standard. Roxan Hockey, M.D. DATE OF BIRTH:  DATE OF PROCEDURE:  02/10/2018 DATE OF DISCHARGE:                              OPERATIVE REPORT   PREOPERATIVE DIAGNOSIS:  Aortopulmonary window mass.  POSTOPERATIVE DIAGNOSIS:  Aortopulmonary window mass.  PROCEDURE:  Mediastinoscopy.  SURGEON:  Revonda Standard. Roxan Hockey, M.D.  ASSISTANT:  None.  ANESTHESIA:  General.  FINDINGS:  Mass was very vascular and difficult to visualize.  Biopsy showed lymphoma versus small cell lung cancer.  CLINICAL NOTE:  Chase Hogan is a 57 year old man who was found last fall to have an aortopulmonary window mass while he was living in [place]. He recently moved to [place].  Repeat CT showed the mass had increased in size.  It was hypermetabolic on PET-CT.  He was advised to undergo mediastinoscopy and possible left anterior mediastinotomy for diagnostic purposes.  The indications, risks, benefits, and alternatives were discussed in detail with the patient.  He understood and accepted the risks and agreed to proceed.  OPERATIVE NOTE:  [place]ase Hogan was brought to the operating room on February 10, 2018.  He had induction of general anesthesia and was intubated. Sequential compression devices were placed on the calves for DVT prophylaxis.  Intravenous antibiotics were administered.  The neck and chest were prepped and draped in usual sterile fashion.  A transverse incision was made 1 fingerbreadth above the sternal notch. Incision was carried through the skin and subcutaneous tissue. Hemostasis was achieved with cautery.  The strap muscles were separated in the midline.  The pretracheal fascia was identified and incised.  The pretracheal plane was developed bluntly into the mediastinum. Mediastinoscope was inserted.  There was relatively  abundant fibrous tissue in the mediastinum and the dissection was relatively slow.  The mass was visualized in the left paratracheal region.  There was some bleeding with the dissection, but cautery was used sparingly in this area due to the left recurrent laryngeal nerve.  A biopsy was taken of the mass and sent for frozen section.  There was bleeding and this was packed with Surgicel and then gauze.  After 5 minutes, the packing was removed and the mediastinoscope was reinserted.  Additional biopsies were taken.  Again, there was bleeding, and this again was treated with Surgicel rather than cautery.  The frozen section returned showing diagnostic material.  It was unclear if this was lymphoma versus small cell lung cancer.  We will await permanent pathology.  The packing was removed.  The mediastinoscope was reinserted.  A final inspection was made for hemostasis.  There was no ongoing bleeding.  Surgicel was left in place at the biopsy site.  The mediastinoscope was removed.  The incision was closed in 2 layers.  The patient was extubated in the operating room and taken to the postanesthetic care unit in good condition.     Revonda Standard Roxan Hockey, M.D.     SCH/MEDQ  D:  02/10/2018  T:  02/10/2018  Job:  [id number]

## 2018-02-10 NOTE — Anesthesia Preprocedure Evaluation (Signed)
Anesthesia Evaluation  Patient identified by MRN, date of birth, ID band Patient awake    Reviewed: Allergy & Precautions, NPO status , Patient's Chart, lab work & pertinent test results  History of Anesthesia Complications Negative for: history of anesthetic complications  Airway Mallampati: II  TM Distance: >3 FB Neck ROM: Full    Dental  (+) Teeth Intact   Pulmonary former smoker,    breath sounds clear to auscultation       Cardiovascular hypertension, Pt. on medications  Rhythm:Regular     Neuro/Psych negative neurological ROS  negative psych ROS   GI/Hepatic negative GI ROS, Neg liver ROS,   Endo/Other  negative endocrine ROS  Renal/GU negative Renal ROS     Musculoskeletal negative musculoskeletal ROS (+)   Abdominal   Peds  Hematology   Anesthesia Other Findings Periaortic mass  Reproductive/Obstetrics                             Anesthesia Physical Anesthesia Plan  ASA: II  Anesthesia Plan: General   Post-op Pain Management:    Induction: Intravenous  PONV Risk Score and Plan: 2 and Ondansetron and Dexamethasone  Airway Management Planned: Oral ETT  Additional Equipment: None  Intra-op Plan:   Post-operative Plan: Extubation in OR  Informed Consent: I have reviewed the patients History and Physical, chart, labs and discussed the procedure including the risks, benefits and alternatives for the proposed anesthesia with the patient or authorized representative who has indicated his/her understanding and acceptance.   Dental advisory given  Plan Discussed with: CRNA and Surgeon  Anesthesia Plan Comments:         Anesthesia Quick Evaluation

## 2018-02-10 NOTE — Interval H&P Note (Signed)
History and Physical Interval Note:  02/10/2018 7:51 AM  Chase Hogan  has presented today for surgery, with the diagnosis of AP WINDOW MASS  The various methods of treatment have been discussed with the patient and family. After consideration of risks, benefits and other options for treatment, the patient has consented to  Procedure(s): MEDIASTINOSCOPY (N/A) possible LEFT ANTERIOR MEDIASTINOTOMY CHAMBERLAIN MCNEIL (N/A) as a surgical intervention .  The patient's history has been reviewed, patient examined, no change in status, stable for surgery.  I have reviewed the patient's chart and labs.  Questions were answered to the patient's satisfaction.     [name]

## 2018-02-11 ENCOUNTER — Encounter (HOSPITAL_COMMUNITY): Payer: Self-pay | Admitting: Thoracic Surgery (Cardiothoracic Vascular Surgery)

## 2018-02-16 ENCOUNTER — Ambulatory Visit (INDEPENDENT_AMBULATORY_CARE_PROVIDER_SITE_OTHER): Payer: 59 | Admitting: Thoracic Surgery (Cardiothoracic Vascular Surgery)

## 2018-02-16 VITALS — BP 140/88 | HR 80 | Resp 20 | Ht 66.0 in | Wt 199.0 lb

## 2018-02-16 DIAGNOSIS — Q214 Aortopulmonary septal defect: Secondary | ICD-10-CM

## 2018-02-16 DIAGNOSIS — D3A8 Other benign neuroendocrine tumors: Secondary | ICD-10-CM | POA: Insufficient documentation

## 2018-02-16 DIAGNOSIS — Z9889 Other specified postprocedural states: Secondary | ICD-10-CM | POA: Diagnosis not present

## 2018-02-16 DIAGNOSIS — J9859 Other diseases of mediastinum, not elsewhere classified: Secondary | ICD-10-CM

## 2018-02-16 NOTE — Progress Notes (Signed)
Chase Hogan [address]       [place] [address]             [phone]     HPI: Chase Hogan returns to discuss the results of his mediastinoscopy  Chase Hogan is a 57 year old man who was found to have an aortopulmonary window mass last fall because of an abnormal chest x-ray.  CT showed a 4.6 x 6.5 cm aortopulmonary window mass.  Bronchoscopy with endobronchial ultrasound showed benign lymphoid cells.    He moved to [place] earlier this year.  Repeat CT showed the mass is increased in size to 5.2 x 7.5 cm.  On PET it was markedly hypermetabolic with an SUV of 49.  I did a mediastinoscopy on him last week.  The procedure was uncomplicated.  Past Medical History:  Diagnosis Date  . Family history of adverse reaction to anesthesia    Daughter had to have smaller breathing tube 67-78 years old  . History of kidney stones   . Hypertension      Current Outpatient Medications  Medication Sig Dispense Refill  . amLODipine (NORVASC) 10 MG tablet Take 1 tablet (10 mg total) by mouth daily. 90 tablet 0  . losartan (COZAAR) 100 MG tablet Take 1 tablet (100 mg total) by mouth daily. 90 tablet 0  . Multiple Vitamin (MULTIVITAMIN WITH MINERALS) TABS tablet Take 1 tablet by mouth daily.     No current facility-administered medications for this visit.     Physical Exam BP 140/88   Pulse 80   Resp 20   Ht '5\' 6"'$  (1.676 m)   Wt 199 lb (90.3 kg)   BMI 32.11 kg/m  57 year old man in no acute distress Neck incision healing well  Diagnostic Tests: FINAL DIAGNOSIS Diagnosis 1. 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 - SEE COMMENT. 2. Soft tissue, biopsy, AP window mass #2 - CONSISTENT WITH NEUROENDOCRINE NEOPLASM. - SEE COMMENT. Microscopic Comment 1. , 2. The tumor cells are strongly positive for CD56, chromogranin, and synaptophysin. They are essentially negative for CD45, cytokeratin 5/6, cytokeratin AE1/AE3, TTF-1 and  cytokeratin 8/18. A Ki-67 stain is positive in less than 5% of the cells. 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 The low Ki-67 proliferation rate would suggest a low(er) grade neoplasm. Dr Gari Crown has reviewed the case and is in essential agreement with this interpretation. (JBK:ecj 02/15/2018) Chase Cutter MD Pathologist, Electronic Signature (Case signed 02/15/2018)  Impression: Chase Hogan is a 57 year old man with a 7 cm aortopulmonary window mass.  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  This appeared to be a low-grade neoplasm which would go along with the time course of his tumor, but not necessarily with a high SUV.  I reviewed his PET images and do not see a primary lung nodule.  There certainly was not anything that went up on PET in the lung itself.  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  I do not think this is resectable for cure.  There is no way to achieve a reasonable margin on the tumor.  Plan: Refer to multidisciplinary thoracic oncology clinic to see oncology and radiation oncology next week.  [name], MD [facility] [phone]

## 2018-02-17 ENCOUNTER — Telehealth: Payer: Self-pay | Admitting: *Deleted

## 2018-02-17 DIAGNOSIS — D3A8 Other benign neuroendocrine tumors: Secondary | ICD-10-CM

## 2018-02-17 NOTE — Telephone Encounter (Signed)
Oncology Nurse Navigator Documentation  Oncology Nurse Navigator Flowsheets 02/17/2018  Navigator Location [facility]-  Referral date to RadOnc/MedOnc 02/16/2018  Navigator Encounter Type Telephone/I received referral on [place] Hogan yesterday. I called today and gave him an appt to be seen next week at [facility].  He verbalized understanding of appt time and place.   Telephone Outgoing Call  Treatment Phase Pre-Tx/Tx Discussion  Barriers/Navigation Needs Education;Coordination of Care  Education Other  Interventions Coordination of Care;Education  Coordination of Care Appts  Education Method Verbal;Written  Acuity Level 2  Acuity Level 2 Educational needs;Assistance expediting appointments  Time Spent with Patient 30

## 2018-02-23 ENCOUNTER — Inpatient Hospital Stay: Payer: 59

## 2018-02-23 ENCOUNTER — Inpatient Hospital Stay: Payer: 59 | Attending: Internal Medicine | Admitting: Internal Medicine

## 2018-02-23 ENCOUNTER — Encounter: Payer: Self-pay | Admitting: Family Medicine

## 2018-02-23 ENCOUNTER — Encounter: Payer: Self-pay | Admitting: Internal Medicine

## 2018-02-23 ENCOUNTER — Other Ambulatory Visit: Payer: Self-pay | Admitting: *Deleted

## 2018-02-23 VITALS — BP 128/76 | HR 76 | Temp 98.3°F | Resp 20 | Wt 201.0 lb

## 2018-02-23 DIAGNOSIS — Z7189 Other specified counseling: Secondary | ICD-10-CM | POA: Insufficient documentation

## 2018-02-23 DIAGNOSIS — Z5111 Encounter for antineoplastic chemotherapy: Secondary | ICD-10-CM | POA: Diagnosis not present

## 2018-02-23 DIAGNOSIS — I10 Essential (primary) hypertension: Secondary | ICD-10-CM | POA: Insufficient documentation

## 2018-02-23 DIAGNOSIS — D3A8 Other benign neuroendocrine tumors: Secondary | ICD-10-CM | POA: Diagnosis not present

## 2018-02-23 LAB — CMP (CANCER CENTER ONLY)
ALT: 16 U/L (ref 0–55)
AST: 17 U/L (ref 5–34)
Albumin: 4.1 g/dL (ref 3.5–5.0)
Alkaline Phosphatase: 97 U/L (ref 40–150)
Anion gap: 7 (ref 3–11)
BUN: 14 mg/dL (ref 7–26)
CHLORIDE: 105 mmol/L (ref 98–109)
CO2: 29 mmol/L (ref 22–29)
Calcium: 10 mg/dL (ref 8.4–10.4)
Creatinine: 1.02 mg/dL (ref 0.70–1.30)
Glucose, Bld: 109 mg/dL (ref 70–140)
POTASSIUM: 3.7 mmol/L (ref 3.5–5.1)
Sodium: 141 mmol/L (ref 136–145)
TOTAL PROTEIN: 7.4 g/dL (ref 6.4–8.3)
Total Bilirubin: 0.3 mg/dL (ref 0.2–1.2)

## 2018-02-23 LAB — CBC WITH DIFFERENTIAL (CANCER CENTER ONLY)
BASOS ABS: 0 10*3/uL (ref 0.0–0.1)
Basophils Relative: 1 %
EOS ABS: 0.2 10*3/uL (ref 0.0–0.5)
EOS PCT: 2 %
HCT: 36.3 % — ABNORMAL LOW (ref 38.4–49.9)
Hemoglobin: 11.9 g/dL — ABNORMAL LOW (ref 13.0–17.1)
LYMPHS ABS: 2.3 10*3/uL (ref 0.9–3.3)
LYMPHS PCT: 34 %
MCH: 27.7 pg (ref 27.2–33.4)
MCHC: 32.8 g/dL (ref 32.0–36.0)
MCV: 84.5 fL (ref 79.3–98.0)
Monocytes Absolute: 0.6 10*3/uL (ref 0.1–0.9)
Monocytes Relative: 9 %
Neutro Abs: 3.8 10*3/uL (ref 1.5–6.5)
Neutrophils Relative %: 54 %
PLATELETS: 377 10*3/uL (ref 140–400)
RBC: 4.3 MIL/uL (ref 4.20–5.82)
RDW: 14.3 % (ref 11.0–14.6)
WBC: 6.9 10*3/uL (ref 4.0–10.3)

## 2018-02-23 NOTE — Progress Notes (Signed)
[place] [phone]   [phone] Multidisciplinary thoracic oncology clinic CONSULT NOTE  REFERRING PHYSICIAN: Dr. Modesto Charon  REASON FOR CONSULTATION:  57 years old white male recently diagnosed with lung cancer.  HPI Chase Hogan is a 57 y.o. male with past medical history significant for hypertension and kidney stones.  He was having a physical exam for job interview in [facility] when chest x-ray showed suspicious lesion in the left lung.  This was followed by CT scan of the chest on 07/14/2017 and that showed AP window 5.0 x 5.2 cm mass partially surrounding the aortic arch, main pulmonary artery and right main pulmonary artery.  Repeat CT scan of the chest on 12/19/2017 showed enlargement of the AP window mass to 7.5 x 5.2 cm.  He underwent bronchoscopy in [place] and the final cytology was negative for malignancy. The patient came back home to [facility] and was seen by Dr. Roxan Hockey.  A PET scan on 01/27/2018 showed lobular mass in the AP window measuring 6.5 x 5.3 with SUV max of 49.  The mass abuts the lower trachea and left lower lobe bronchus.  There was also 2.4 cm right lobe thyroid nodule with low hypermetabolic activity. On 02/10/2018 the patient underwent mediastinoscopy under the care of Dr. Roxan Hockey.  The mass was very vascular and difficult to visualize. The final pathology ([id number]) was consistent with neuroendocrine neoplasm.  The tumor cells were strongly positive for CD 56, chromogranin and synaptophysin.  They were essentially negative for CD45, cytokeratin 5/6, cytokeratin AE1/AE3, TTF-1 and cytokeratin 8/18.  A Ki-67 stain was positive and less than 5% of the cells.  Overall, giving the strong staining with chromogranin, synaptophysin and CD 56, and neuroendocrine neoplasm is favored.  The low Ki-67 proliferation rate would suggest a low-grade neoplasm. The patient was not a good surgical candidate  for resection and Dr. Roxan Hockey kindly referred him to me for further evaluation and recommendation regarding his condition. When seen today the patient is feeling fine with no specific complaints except for shortness of breath with exertion but he denied having any significant chest pain, cough or hemoptysis.  He denied having any recent weight loss or night sweats.  He has no nausea, vomiting, diarrhea or constipation.  He denied having any headache or visual changes. Family history significant for mother with ovarian cancer, father still alive and has a pacemaker. The patient is married and has 2 biological children and 1 stepchild.  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  He was accompanied today by his wife Chase Hogan.  He has a history for smoking for around 4 years in his 55s.  He chewed tobacco at regular basis and also drinks beer on daily basis.  He denied having any history of drug abuse.  HPI  Past Medical History:  Diagnosis Date  . Family history of adverse reaction to anesthesia    Daughter had to have smaller breathing tube 58-22 years old  . History of kidney stones   . Hypertension     Past Surgical History:  Procedure Laterality Date  . BRONCHOSCOPY  08/17/2017  . COLONOSCOPY  2016  . LITHOTRIPSY     "a few times"  . LUNG BIOPSY  08/2017   pt unsure about this  . MEDIASTINOSCOPY N/A 02/10/2018   Procedure: MEDIASTINOSCOPY;  Surgeon: [name], MD;  Location: [facility];  Service: Thoracic;  Laterality: N/A;    Family History  Problem Relation Age of Onset  . Cancer Mother   . Other Father        has pacemaker    Social History Social History   Tobacco Use  . Smoking status: Former Research scientist (life sciences)  . Smokeless tobacco: Current User    Types: Chew  . Tobacco comment: quit in 20's  Substance Use Topics  . Alcohol use: Yes    Alcohol/week: 1.2 - 1.8 oz    Types: 2 - 3 Cans of beer per week    Comment: Beer a day (2-3 a week?)  . Drug  use: No    Allergies  Allergen Reactions  . Lisinopril Cough    Current Outpatient Medications  Medication Sig Dispense Refill  . amLODipine (NORVASC) 10 MG tablet Take 1 tablet (10 mg total) by mouth daily. 90 tablet 0  . losartan (COZAAR) 100 MG tablet Take 1 tablet (100 mg total) by mouth daily. 90 tablet 0  . Multiple Vitamin (MULTIVITAMIN WITH MINERALS) TABS tablet Take 1 tablet by mouth daily.     No current facility-administered medications for this visit.     Review of Systems  Constitutional: negative Eyes: negative Ears, nose, mouth, throat, and face: negative Respiratory: positive for dyspnea on exertion Cardiovascular: negative Gastrointestinal: negative Genitourinary:negative Integument/breast: negative Hematologic/lymphatic: negative Musculoskeletal:negative Neurological: negative Behavioral/Psych: negative Endocrine: negative Allergic/Immunologic: negative  Physical Exam  BOF:BPZWC, healthy, no distress, well nourished, well developed and anxious SKIN: skin color, texture, turgor are normal, no rashes or significant lesions HEAD: Normocephalic, No masses, lesions, tenderness or abnormalities EYES: normal, PERRLA, Conjunctiva are pink and non-injected EARS: External ears normal, Canals clear OROPHARYNX:no exudate, no erythema and lips, buccal mucosa, and tongue normal  NECK: supple, no adenopathy, no JVD LYMPH:  no palpable lymphadenopathy, no hepatosplenomegaly LUNGS: clear to auscultation , and palpation HEART: regular rate & rhythm, no murmurs and no gallops ABDOMEN:abdomen soft, non-tender, normal bowel sounds and no masses or organomegaly BACK: No CVA tenderness, Range of motion is normal EXTREMITIES:no joint deformities, effusion, or inflammation, no edema, no skin discoloration  NEURO: alert & oriented x 3 with fluent speech, no focal motor/sensory deficits  PERFORMANCE STATUS: ECOG 1  LABORATORY DATA: Lab Results  Component Value Date    WBC 6.9 02/23/2018   HGB 11.9 (L) 02/23/2018   HCT 36.3 (L) 02/23/2018   MCV 84.5 02/23/2018   PLT 377 02/23/2018      Chemistry      Component Value Date/Time   NA 137 02/08/2018 1425   K 3.7 02/08/2018 1425   CL 105 02/08/2018 1425   CO2 22 02/08/2018 1425   BUN 10 02/08/2018 1425   CREATININE 0.95 02/08/2018 1425      Component Value Date/Time   CALCIUM 9.5 02/08/2018 1425   ALKPHOS 89 02/08/2018 1425   AST 19 02/08/2018 1425   ALT 18 02/08/2018 1425   BILITOT 0.5 02/08/2018 1425       RADIOGRAPHIC STUDIES: Dg Chest 2 View  Result Date: 02/09/2018 CLINICAL DATA:  Preoperative exam for mediastinoscopy. EXAM: CHEST - 2 VIEW COMPARISON:  01/27/2018 PET FINDINGS: Heart size is normal. Mass density in the 8 aortopulmonary window in region of the aortic arch as shown previously. The lung parenchyma is clear. No effusions. No significant bone finding. IMPRESSION: Mass density in the aortopulmonary window and region of the aortic arch. Electronically Signed   By: Nelson Chimes M.D.   On: 02/09/2018 08:03   Nm Pet Image Initial (pi) Skull Base To Thigh  Result Date: 01/27/2018 CLINICAL DATA:  Initial treatment strategy for lung mass. EXAM: NUCLEAR MEDICINE PET SKULL BASE TO THIGH TECHNIQUE: 10.0 mCi F-18 FDG was injected intravenously. Full-ring PET imaging was performed from the skull base to thigh after the radiotracer. CT data was obtained and used for attenuation correction and anatomic localization. Fasting blood glucose: 108 mg/dl COMPARISON:  None. FINDINGS: Mediastinal blood pool activity: SUV max 2.2 NECK: No hypermetabolic lymph nodes in the neck. Incidental CT findings: 2.4 cm nodule in the RIGHT lobe of thyroid gland without significant metabolic activity. CHEST: Lobular mass the AP window measures 6.5 x 5.3 cm and has intense metabolic activity with SUV max equal 49. Mass abuts the lower trachea and LEFT mainstem bronchus. No additional hypermetabolic mediastinal lymph nodes.  No hypermetabolic or enlarged pulmonary nodules. Incidental CT findings: none ABDOMEN/PELVIS: No abnormal hypermetabolic activity within the liver, pancreas, adrenal glands, or spleen. No hypermetabolic lymph nodes in the abdomen or pelvis. Incidental CT findings: Benign LEFT adrenal adenoma. Bilateral renal calculi without obstruction. SKELETON: No focal hypermetabolic activity to suggest skeletal metastasis. Incidental CT findings: none IMPRESSION: 1. Solitary hypermetabolic AP window mass with intense radiotracer activity (SUV max 49). Activity this high is suggestive of lymphoma. 2. No pulmonary parenchymal lesion. 3. No extra thoracic malignancy identified. Electronically Signed   By: Suzy Bouchard M.D.   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   PLAN: I had a lengthy discussion with the patient and his wife today about his current condition as well as treatment options. I explained to the patient that the best option for treatment of low-grade neuroendocrine carcinoma is surgical resection but unfortunately with the location of this tumor he may not be a good surgical candidate for resection.  He was seen by Dr. Roxan Hockey and surgery was not an option. I discussed with the patient other option for management of his condition including continuous observation and monitoring but there is a high risk for this tumor to continue to increase in size and block his airway. The other option would be to consider the patient for a course of concurrent chemo and radiation with either carboplatin/cisplatin and etoposide especially with the significant hypermetabolic activity seen with this tumor.  I explained to the patient that low-grade neuroendocrine carcinoma may  not respond as well as high-grade neuroendocrine carcinoma to systemic chemotherapy.  Third option would be to try the patient on treatment with Afinitor but I am not sure how effective this treatment will be for this locally advanced low-grade neuroendocrine carcinoma. I also discussed with the patient referral to Dr. Reece Levy or Dr. Durenda Hurt at [facility] for a second opinion and recommendation regarding his condition. The patient is interested in this approach before he makes a final decision about his treatment plan. I will request the referral and arrange for the patient to come back for follow-up visit after the second opinion. He was advised to call immediately if he has any concerning symptoms in the interval. The patient was seen during the multidisciplinary thoracic oncology clinic by medical oncology, thoracic navigator as well as social worker. The patient voices understanding of current disease status and treatment options and is in agreement with the current care plan.  All questions were answered. The patient knows to  call the clinic with any problems, questions or concerns. We can certainly see the patient much sooner if necessary.  Thank you so much for allowing me to participate in the care of Chase Hogan. I will continue to follow up the patient with you and assist in his care.  I spent 55 minutes counseling the patient face to face. The total time spent in the appointment was 80 minutes.  Disclaimer: This note was dictated with voice recognition software. Similar sounding words can inadvertently be transcribed and may not be corrected upon review.   Eilleen Kempf Feb 23, 2018, 3:02 PM

## 2018-02-24 ENCOUNTER — Encounter: Payer: Self-pay | Admitting: *Deleted

## 2018-02-24 NOTE — Progress Notes (Signed)
Oncology Nurse Navigator Documentation  Oncology Nurse Navigator Flowsheets 02/24/2018  Navigator Location [facility]  Navigator Encounter Type Clinic/MDC/spoke with patient and wife yesterday at thoracic clinic.  I gave and explained information on lung cancer and treatment plan. Patient is being referred to [facility] for a second opinion.  I will update HIM of referral to help expedite.   Abnormal Finding Date 12/22/2017  Confirmed Diagnosis Date 02/10/2018  Multidisiplinary Clinic Date 02/23/2018  Patient Visit Type MedOnc  Treatment Phase Pre-Tx/Tx Discussion  Barriers/Navigation Needs Education;Coordination of Care  Education Understanding Cancer/ Treatment Options  Interventions Coordination of Care;Education  Coordination of Care Other  Education Method Verbal;Written  Acuity Level 2  Time Spent with Patient 30

## 2018-02-28 ENCOUNTER — Telehealth: Payer: Self-pay | Admitting: *Deleted

## 2018-02-28 ENCOUNTER — Telehealth: Payer: Self-pay | Admitting: Internal Medicine

## 2018-02-28 NOTE — Telephone Encounter (Signed)
FAXED RECORDS TO [facility] AT [place] . [facility] WILL PROCESS RECORDS AND CALL PT WITH APPT.

## 2018-02-28 NOTE — Telephone Encounter (Signed)
Oncology Nurse Navigator Documentation  Oncology Nurse Navigator Flowsheets 02/28/2018  Navigator Location [facility]  Navigator Encounter Type Telephone/I called HIM dept to check on patient's referral to [facility]. I spoke with Maudie Mercury and she is working on this now.  I called patient to update him. I was unable to reach him but did leave vm message with an update and to call if needed.   Telephone Outgoing Call  Treatment Phase Pre-Tx/Tx Discussion  Barriers/Navigation Needs Education;Coordination of Care  Education Other  Interventions Coordination of Care;Education  Coordination of Care Other  Education Method Verbal  Acuity Level 2  Time Spent with Patient 30

## 2018-08-07 IMAGING — PT NM PET TUM IMG INITIAL (PI) SKULL BASE T - THIGH
8 series · 25 of 25 positions shown · non-contrast
Comparison: None.

CLINICAL DATA: Initial treatment strategy for lung mass.

EXAM:
NUCLEAR MEDICINE PET SKULL BASE TO THIGH
TECHNIQUE: 10.0 mCi F-18 FDG was injected intravenously. Full-ring PET imaging
was performed from the skull base to thigh after the radiotracer. CT
data was obtained and used for attenuation correction and anatomic
localization.
Fasting blood glucose: 108 mg/dl

[Series 3: pet sk_thigh ac · axial · 5.0mm · 4.07mm/px · z∈[-961,-17]mm · 5 of 237 slices shown]
[im 1/237]
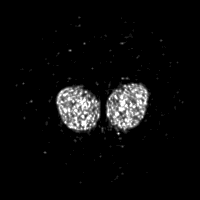
[im 60/237]
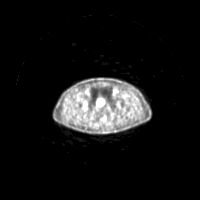
[im 119/237]
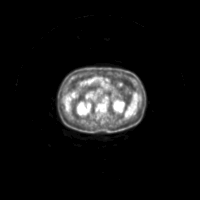
[im 178/237]
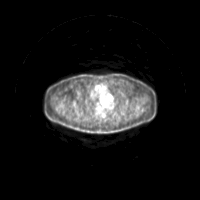
[im 237/237]
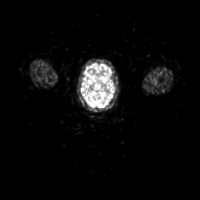

[Series 4: ct sk_thigh 5.0 b31f · axial · 5.0mm · 0.98mm/px · z∈[-961,-17]mm · 5 of 237 slices shown]
[im 1/237]
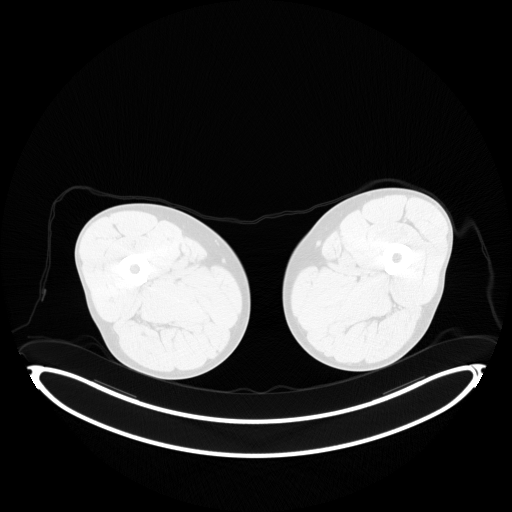
[im 60/237]
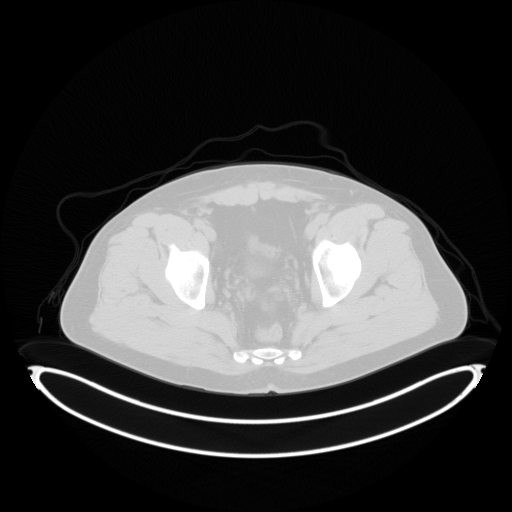
[im 119/237]
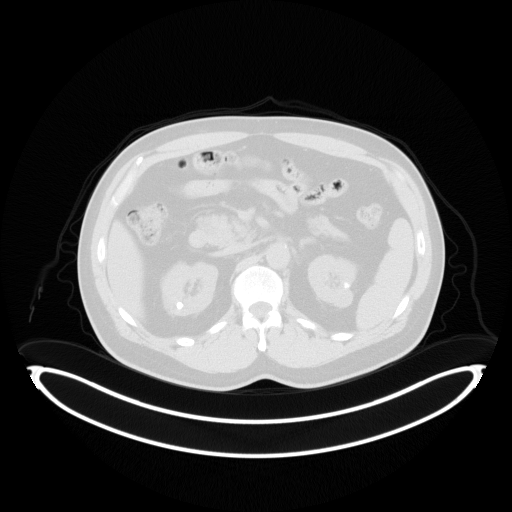
[im 178/237]
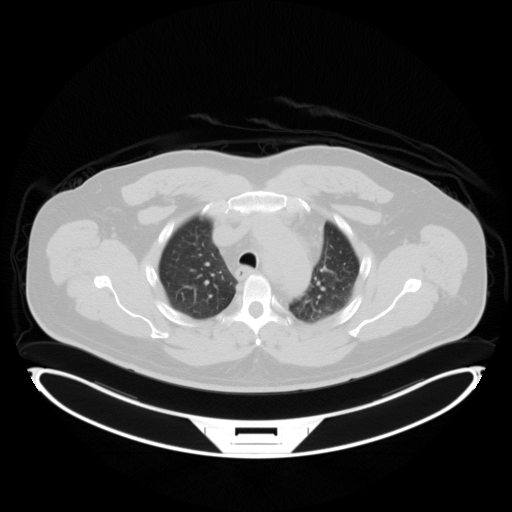
[im 237/237  brain]
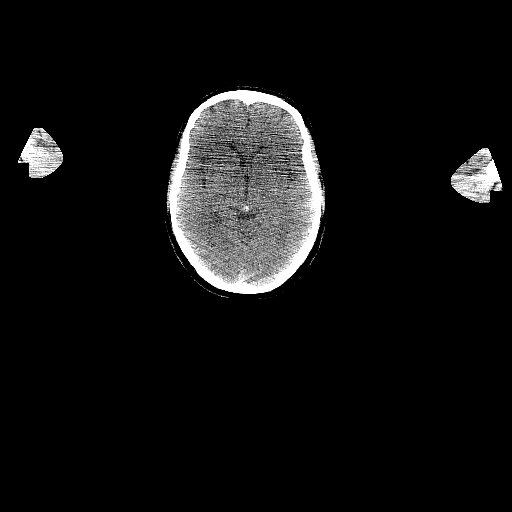

[Series 5: pet sk_thigh nac · axial · 5.0mm · 4.07mm/px · z∈[-961,-17]mm · 5 of 237 slices shown]
[im 1/237]
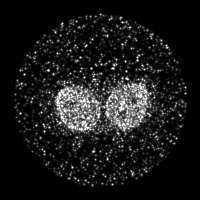
[im 60/237]
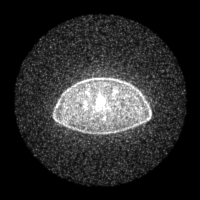
[im 119/237]
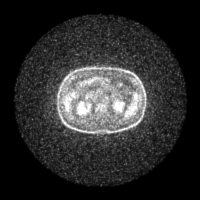
[im 178/237]
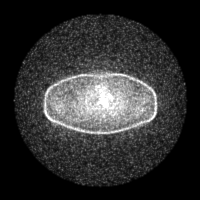
[im 237/237]
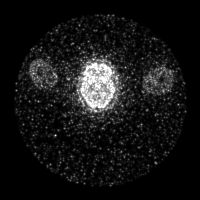

[Series 8: ct sk_thigh 5.0 b70f (id)_bone · axial · 5.0mm · 0.67mm/px · 1 of 67 slices shown]
[im 1/67  bone]
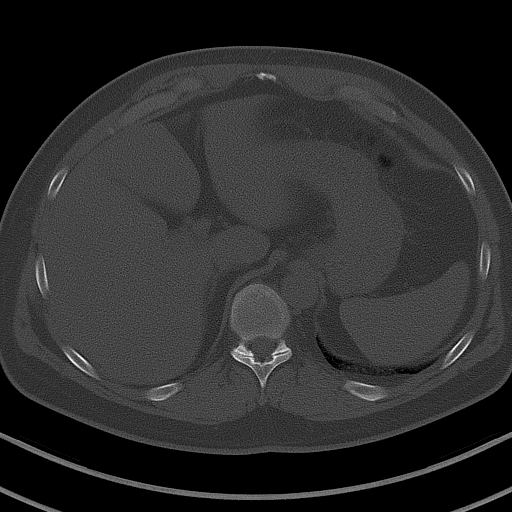

[Series 603: mip range 2 · coronal · 1.96mm/px · 1 of 32 slices shown]
[im 1/32]
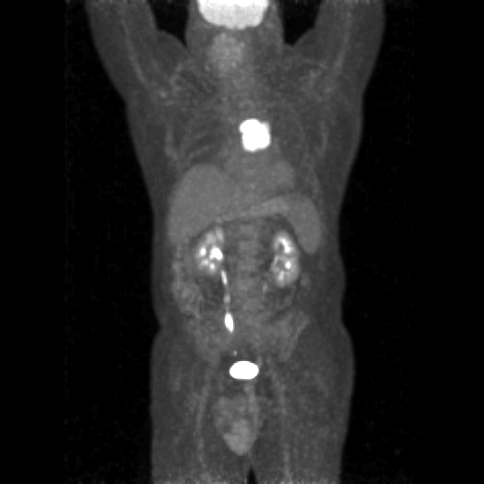

[Series 604: range-ct sk_thigh 5.0 (id)<alpha range> · 2 of 80 slices shown (1 of 2)]
[im 1/80]
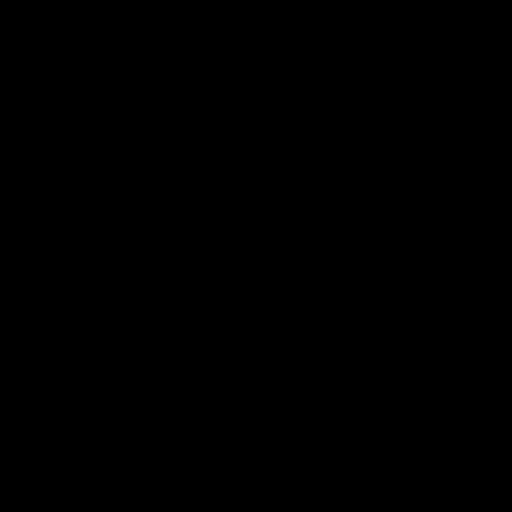
[im 80/80]
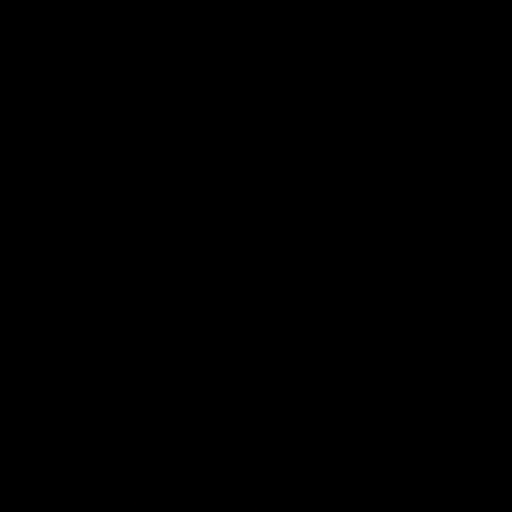

[Series 605: range-ct sk_thigh 5.0 (id)<alpha range> · 5 of 228 slices shown (2 of 2)]
[im 1/228]
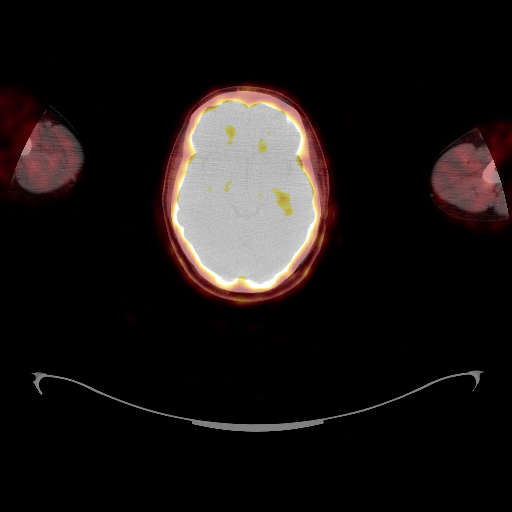
[im 57/228]
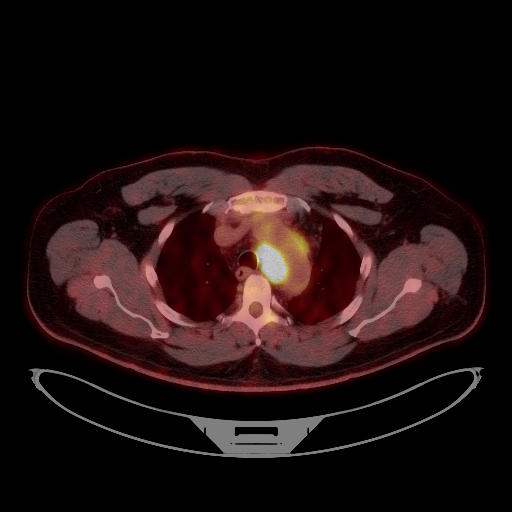
[im 114/228]
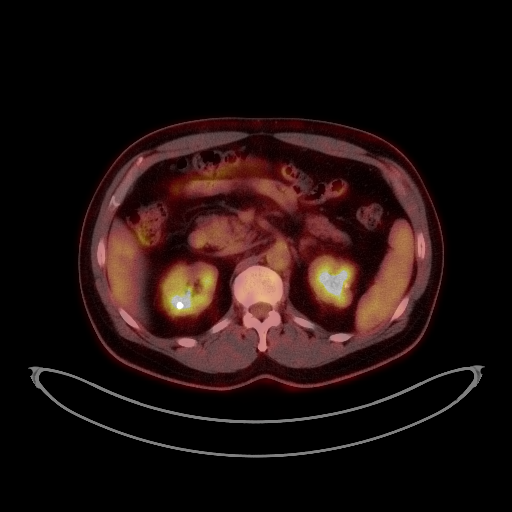
[im 171/228]
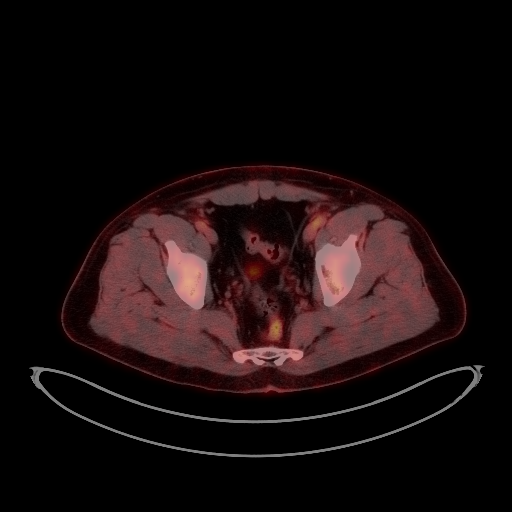
[im 228/228]
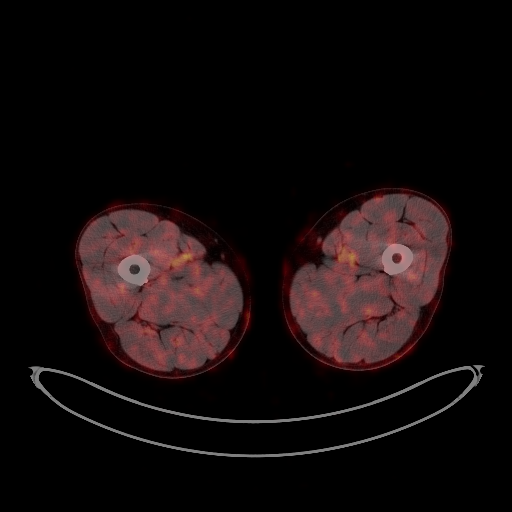

[Series 1068: results mm oncology reading · 1.0mm · 0.89mm/px · 1 of 2 slices shown]
[im 1/2]
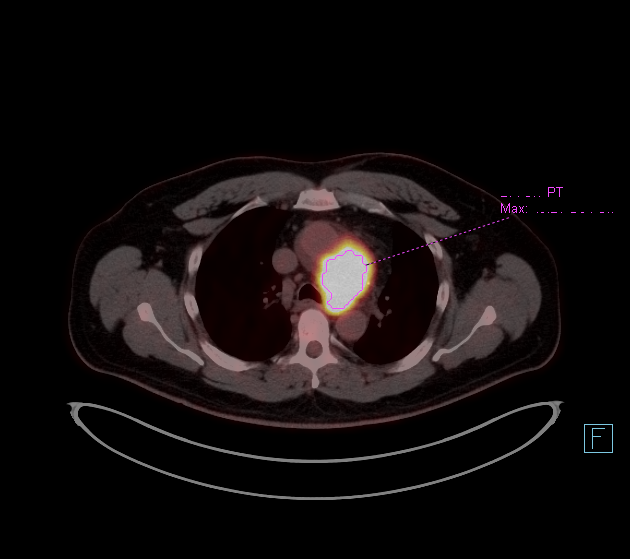

[25 of 25 positions shown; findings below may reference images not displayed]

FINDINGS: Mediastinal blood pool activity: SUV max

NECK: No hypermetabolic lymph nodes in the neck.

Incidental CT findings: 2.4 cm nodule in the RIGHT lobe of thyroid
gland without significant metabolic activity.

CHEST: Lobular mass the AP window measures 6.5 x 5.3 cm and has
intense metabolic activity with SUV max equal 49. Mass abuts the
lower trachea and LEFT mainstem bronchus.

No additional hypermetabolic mediastinal lymph nodes. No
hypermetabolic or enlarged pulmonary nodules.

Incidental CT findings: none

ABDOMEN/PELVIS: No abnormal hypermetabolic activity within the
liver, pancreas, adrenal glands, or spleen. No hypermetabolic lymph
nodes in the abdomen or pelvis.

Incidental CT findings: Benign LEFT adrenal adenoma. Bilateral renal
calculi without obstruction.

SKELETON: No focal hypermetabolic activity to suggest skeletal
metastasis.

Incidental CT findings: none
IMPRESSION: 1. Solitary hypermetabolic AP window mass with intense radiotracer
activity (SUV max 49). Activity this high is suggestive of lymphoma.
2. No pulmonary parenchymal lesion.
3. No extra thoracic malignancy identified.

## 2019-02-08 ENCOUNTER — Telehealth: Payer: Self-pay

## 2019-02-08 NOTE — Telephone Encounter (Signed)
I rather you give [place] the paper if it is faxed, if not printed, then send message this way. I prefer to save trees

## 2019-02-14 ENCOUNTER — Other Ambulatory Visit: Payer: Self-pay | Admitting: Cardiology

## 2019-02-14 MED ORDER — LOSARTAN POTASSIUM 100 MG PO TABS: 100.0000 mg | ORAL_TABLET | Freq: Every day | ORAL | 3 refills | Status: DC

## 2019-03-20 ENCOUNTER — Other Ambulatory Visit: Payer: Self-pay | Admitting: Cardiology

## 2019-03-20 MED ORDER — AMLODIPINE BESYLATE 10 MG PO TABS: 10.0000 mg | ORAL_TABLET | Freq: Every day | ORAL | 3 refills | Status: DC

## 2019-05-11 ENCOUNTER — Other Ambulatory Visit: Payer: Self-pay

## 2019-05-11 ENCOUNTER — Telehealth: Payer: Self-pay

## 2019-05-11 ENCOUNTER — Other Ambulatory Visit: Payer: Self-pay | Admitting: Cardiology

## 2019-05-11 DIAGNOSIS — G4700 Insomnia, unspecified: Secondary | ICD-10-CM

## 2019-05-11 MED ORDER — TRAZODONE HCL 50 MG PO TABS: 50.0000 mg | ORAL_TABLET | Freq: Every evening | ORAL | 6 refills | Status: DC | PRN

## 2019-05-11 NOTE — Telephone Encounter (Signed)
Fax from pharmacy requesting rf on trazodone

## 2019-08-08 ENCOUNTER — Ambulatory Visit: Payer: 59 | Admitting: Cardiology

## 2019-12-18 ENCOUNTER — Other Ambulatory Visit: Payer: Self-pay | Admitting: Cardiology

## 2019-12-18 DIAGNOSIS — G4700 Insomnia, unspecified: Secondary | ICD-10-CM

## 2020-01-13 ENCOUNTER — Other Ambulatory Visit: Payer: Self-pay | Admitting: Cardiology

## 2020-01-13 DIAGNOSIS — G4700 Insomnia, unspecified: Secondary | ICD-10-CM

## 2020-01-14 NOTE — Telephone Encounter (Signed)
Please check and see when I saw patients last. I have not seen him in > 1 year. Also non cardiac non essential drug from cardiac standpoint. So no.   JG

## 2020-02-23 ENCOUNTER — Other Ambulatory Visit: Payer: Self-pay | Admitting: Cardiology

## 2020-03-19 ENCOUNTER — Other Ambulatory Visit: Payer: Self-pay | Admitting: Cardiology

## 2020-03-23 ENCOUNTER — Other Ambulatory Visit: Payer: Self-pay | Admitting: Cardiology

## 2020-07-26 ENCOUNTER — Other Ambulatory Visit: Payer: Self-pay | Admitting: Cardiology

## 2020-07-26 DIAGNOSIS — G4700 Insomnia, unspecified: Secondary | ICD-10-CM
